# Patient Record
Sex: Male | Born: 2005 | Race: Black or African American | Hispanic: No | Marital: Single
Health system: Southern US, Community
[De-identification: ages and names within clinical notes are randomized; demographics above are authoritative.]

---
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
## 2024-03-03 ENCOUNTER — Encounter: Payer: Self-pay | Admitting: Family Medicine

## 2024-03-03 ENCOUNTER — Ambulatory Visit: Payer: Self-pay | Admitting: Family Medicine

## 2024-03-03 VITALS — BP 114/80 | HR 94 | Temp 97.8°F | Ht 74.0 in | Wt 162.0 lb

## 2024-03-03 DIAGNOSIS — J302 Other seasonal allergic rhinitis: Secondary | ICD-10-CM

## 2024-03-03 DIAGNOSIS — K219 Gastro-esophageal reflux disease without esophagitis: Secondary | ICD-10-CM

## 2024-03-03 MED ORDER — FAMOTIDINE 40 MG PO TABS: 40.0000 mg | ORAL_TABLET | Freq: Every day | ORAL | 2 refills | Status: AC

## 2024-03-03 MED ORDER — FAMOTIDINE 40 MG PO TABS: 40.0000 mg | ORAL_TABLET | Freq: Every day | ORAL | 2 refills | Status: DC

## 2024-03-03 NOTE — Addendum Note (Signed)
 Addended by: Cathleen Fears, Annalucia Laino P on: 03/03/2024 09:02 AM   Modules accepted: Orders

## 2024-03-03 NOTE — Progress Notes (Signed)
 New Patient Office Visit  Subjective    Patient ID: Marc Maddox, male    DOB: [birth year]  Age: 18 y.o. MRN: [id number]  CC:  Chief Complaint  Patient presents with   Establish Care    Cramp and pain on stomach since 6 months     HPI Marc Maddox presents to establish care He will graduate HS this year from [employer]   Plans to go to [employer] or [employer] for visual arts  Seasonal allergies - takes Zyrtec   Complains of intermittent epigastric discomfort for the past 6 months to 1 year.  Mainly in the morning time.  He also notes burning in his throat at times.  He does eat and lay down within an hour or so.  Denies weight loss, nausea, vomiting, diarrhea or constipation.  Denies any changes to his bowel habits.  Reports a good appetite. States dairy does upset his stomach at times.  Reports history of depression and OCD.  He is seeing a therapist and managing well.  Denies feeling depressed now.  No alcohol, drug use or smoking.  Denies ever being sexually active.    Outpatient Encounter Medications as of 03/03/2024  Medication Sig   famotidine (PEPCID) 40 MG tablet Take 1 tablet (40 mg total) by mouth at bedtime.   No facility-administered encounter medications on file as of 03/03/2024.    History reviewed. No pertinent past medical history.  History reviewed. No pertinent surgical history.  History reviewed. No pertinent family history.  Social History   Socioeconomic History   Marital status: Single    Spouse name: Not on file   Number of children: Not on file   Years of education: Not on file   Highest education level: Not on file  Occupational History   Not on file  Tobacco Use   Smoking status: Never   Smokeless tobacco: Never  Substance and Sexual Activity   Alcohol use: Never   Drug use: Never   Sexual activity: Never  Other Topics Concern   Not on file  Social History Narrative   Not on file   Social Drivers of Health    Financial Resource Strain: Not on file  Food Insecurity: Not on file  Transportation Needs: Not on file  Physical Activity: Not on file  Stress: Not on file  Social Connections: Not on file  Intimate Partner Violence: Not on file    Review of Systems  Constitutional:  Negative for chills, fever, malaise/fatigue and weight loss.  HENT:         PND  Respiratory:  Negative for shortness of breath.   Cardiovascular:  Negative for chest pain, palpitations and leg swelling.  Gastrointestinal:  Positive for abdominal pain. Negative for blood in stool, constipation, diarrhea, nausea and vomiting.  Genitourinary:  Negative for dysuria, frequency and urgency.  Neurological:  Negative for dizziness and focal weakness.  Psychiatric/Behavioral:  Negative for depression and substance abuse. The patient is not nervous/anxious and does not have insomnia.         Objective    BP 114/80 (BP Location: Left Arm, Patient Position: Sitting, Cuff Size: Normal)   Pulse 94   Temp 97.8 F (36.6 C) (Temporal)   Ht 6\' 2"  (1.88 m)   Wt 162 lb (73.5 kg)   SpO2 99%   BMI 20.80 kg/m   Physical Exam Constitutional:      General: He is not in acute distress.    Appearance: He is not  ill-appearing.  HENT:     Mouth/Throat:     Mouth: Mucous membranes are moist.     Pharynx: Oropharynx is clear.  Eyes:     Extraocular Movements: Extraocular movements intact.     Conjunctiva/sclera: Conjunctivae normal.  Cardiovascular:     Rate and Rhythm: Normal rate and regular rhythm.  Pulmonary:     Effort: Pulmonary effort is normal.     Breath sounds: Normal breath sounds.  Abdominal:     General: Abdomen is flat. Bowel sounds are normal.     Palpations: Abdomen is soft.     Tenderness: There is no abdominal tenderness. There is no guarding.  Musculoskeletal:        General: Normal range of motion.     Cervical back: Normal range of motion and neck supple. No tenderness.     Right lower leg: No  edema.     Left lower leg: No edema.  Lymphadenopathy:     Cervical: No cervical adenopathy.  Skin:    General: Skin is warm and dry.  Neurological:     General: No focal deficit present.     Mental Status: He is alert and oriented to person, place, and time.     Motor: No weakness.     Coordination: Coordination normal.     Gait: Gait normal.  Psychiatric:        Mood and Affect: Mood normal.        Behavior: Behavior normal.        Thought Content: Thought content normal.         Assessment & Plan:   Problem List Items Addressed This Visit   None Visit Diagnoses       Gastroesophageal reflux disease, unspecified whether esophagitis present    -  Primary   Relevant Medications   famotidine (PEPCID) 40 MG tablet     Seasonal allergies          He is a pleasant 18 year old male who is new to the practice and here to establish care. Counseling on lifestyle modifications for GERD.  Start famotidine daily in the evening.  Avoid eating and laying down.  Continue Zyrtec for allergies. History of depression and sees a counselor every 2 weeks.  He is doing well and denies feeling depressed today. Recommend follow-up in 4 to 6 weeks for a CPE and to follow-up on current symptoms.  Return in about 6 weeks (around 04/14/2024) for fasting CPE.   Hetty Blend, NP-C

## 2024-03-03 NOTE — Patient Instructions (Signed)
 Start taking prescribed Pepcid in the evening.  Take this daily for the next 4 to 6 weeks.  Avoid foods that upset your stomach.  Avoid eating large amounts of food at 1 time.  Do not eat and lay down within 2 to 3 hours.  Please bring in your list of immunizations for your follow-up visit.  We will check labs and do your physical exam at that time.
